# Patient Record
Sex: Female | Born: 1980 | Race: Black or African American | Hispanic: No | Marital: Married | Smoking: Never smoker
Health system: Southern US, Community
[De-identification: ages and names within clinical notes are randomized; demographics above are authoritative.]

---

## 2020-03-13 ENCOUNTER — Ambulatory Visit (HOSPITAL_COMMUNITY)
Admission: EM | Admit: 2020-03-13 | Discharge: 2020-03-13 | Disposition: A | Discharge status: Home / Self Care | Payer: Medicaid Other | Attending: Family Medicine | Admitting: Family Medicine

## 2020-03-13 ENCOUNTER — Other Ambulatory Visit: Payer: Self-pay

## 2020-03-13 ENCOUNTER — Ambulatory Visit (HOSPITAL_COMMUNITY): Payer: Medicaid Other

## 2020-03-13 ENCOUNTER — Encounter (HOSPITAL_COMMUNITY): Payer: Self-pay

## 2020-03-13 ENCOUNTER — Ambulatory Visit (INDEPENDENT_AMBULATORY_CARE_PROVIDER_SITE_OTHER): Payer: Medicaid Other

## 2020-03-13 DIAGNOSIS — R112 Nausea with vomiting, unspecified: Secondary | ICD-10-CM | POA: Insufficient documentation

## 2020-03-13 DIAGNOSIS — R1110 Vomiting, unspecified: Secondary | ICD-10-CM | POA: Diagnosis not present

## 2020-03-13 LAB — CBC WITH DIFFERENTIAL/PLATELET
Abs Immature Granulocytes: 0.03 10*3/uL (ref 0.00–0.07)
Basophils Absolute: 0 10*3/uL (ref 0.0–0.1)
Basophils Relative: 1 %
Eosinophils Absolute: 0.1 10*3/uL (ref 0.0–0.5)
Eosinophils Relative: 2 %
HCT: 41.4 % (ref 36.0–46.0)
Hemoglobin: 13.8 g/dL (ref 12.0–15.0)
Immature Granulocytes: 1 %
Lymphocytes Relative: 26 %
Lymphs Abs: 1.7 10*3/uL (ref 0.7–4.0)
MCH: 29.7 pg (ref 26.0–34.0)
MCHC: 33.3 g/dL (ref 30.0–36.0)
MCV: 89.2 fL (ref 80.0–100.0)
Monocytes Absolute: 0.5 10*3/uL (ref 0.1–1.0)
Monocytes Relative: 7 %
Neutro Abs: 4.2 10*3/uL (ref 1.7–7.7)
Neutrophils Relative %: 63 %
Platelets: 224 10*3/uL (ref 150–400)
RBC: 4.64 MIL/uL (ref 3.87–5.11)
RDW: 13.4 % (ref 11.5–15.5)
WBC: 6.5 10*3/uL (ref 4.0–10.5)
nRBC: 0 % (ref 0.0–0.2)

## 2020-03-13 LAB — POCT URINALYSIS DIPSTICK, ED / UC
Glucose, UA: NEGATIVE mg/dL
Ketones, ur: 80 mg/dL — AB
Leukocytes,Ua: NEGATIVE
Nitrite: NEGATIVE
Protein, ur: 30 mg/dL — AB
Specific Gravity, Urine: >=1.03 (ref 1.005–1.030)
Urobilinogen, UA: 1 mg/dL (ref 0.0–1.0)
pH: 5 (ref 5.0–8.0)

## 2020-03-13 LAB — COMPREHENSIVE METABOLIC PANEL
ALT: 16 U/L (ref 0–44)
AST: 17 U/L (ref 15–41)
Albumin: 3.8 g/dL (ref 3.5–5.0)
Alkaline Phosphatase: 55 U/L (ref 38–126)
Anion gap: 12 (ref 5–15)
BUN: 10 mg/dL (ref 6–20)
CO2: 22 mmol/L (ref 22–32)
Calcium: 9.2 mg/dL (ref 8.9–10.3)
Chloride: 106 mmol/L (ref 98–111)
Creatinine, Ser: 0.94 mg/dL (ref 0.44–1.00)
GFR, Estimated: >60 mL/min (ref >60)
Glucose, Bld: 87 mg/dL (ref 70–99)
Potassium: 4.4 mmol/L (ref 3.5–5.1)
Sodium: 140 mmol/L (ref 135–145)
Total Bilirubin: 0.8 mg/dL (ref 0.3–1.2)
Total Protein: 7.1 g/dL (ref 6.5–8.1)

## 2020-03-13 LAB — POC URINE PREG, ED: Preg Test, Ur: NEGATIVE

## 2020-03-13 LAB — LIPASE, BLOOD: Lipase: 28 U/L (ref 11–51)

## 2020-03-13 MED ORDER — ONDANSETRON HCL 4 MG/2ML IJ SOLN
4.0000 mg | Freq: Once | INTRAMUSCULAR | Status: AC
  Administered 2020-03-13: 4 mg via INTRAVENOUS

## 2020-03-13 MED ORDER — OMEPRAZOLE 20 MG PO CPDR: 20.0000 mg | DELAYED_RELEASE_CAPSULE | Freq: Two times a day (BID) | ORAL | 0 refills | Status: AC

## 2020-03-13 MED ORDER — ONDANSETRON HCL 4 MG PO TABS
4.0000 mg | ORAL_TABLET | Freq: Three times a day (TID) | ORAL | 0 refills | Status: AC | PRN
Start: 2020-03-13 — End: ?

## 2020-03-13 MED ORDER — SODIUM CHLORIDE 0.9 % IV BOLUS
1000.0000 mL | Freq: Once | INTRAVENOUS | Status: AC
  Administered 2020-03-13: 1000 mL via INTRAVENOUS

## 2020-03-13 MED ORDER — ONDANSETRON HCL 4 MG/2ML IJ SOLN
INTRAMUSCULAR | Status: AC
  Filled 2020-03-13: qty 2

## 2020-03-13 NOTE — ED Notes (Signed)
IV fluids infusing.  Pt assisted to restroom to provide urine sample.

## 2020-03-13 NOTE — Discharge Instructions (Addendum)
Take omeprazole 2 times a day This will reduce stomach acid Take Zofran as needed for nausea and vomiting Try to increase your fluids. Call or return if not improving in a couple days Go to ER immediately if you are worse instead of better

## 2020-03-13 NOTE — ED Provider Notes (Signed)
[facility]    CSN: [id number] Arrival date & time: 03/13/20  8295      History   Chief Complaint Chief Complaint  Patient presents with  . Emesis    x 1 week  . Weakness    x 1 week    HPI Miranda Henry is a 39 y.o. female.   HPI Patient states that she is generally in good health.  On no prescription medications.  She has had nausea and vomiting for a week.  Is unable to keep down solid food.  Has not had much in the way of liquids.  She states that she had bowel movements earlier in the week but has not had one since Wednesday.  No loose bowels.  No fever or chills.  She did not have any exposure to illness.  Her 4 children and husband are well.  No foods that do not agree with her food allergies.  She is certain that she is not pregnant.  She has very little abdominal pain, just some crampiness at times.  Anything she eats comes right back up.  She states that she started to feel weak.  When she sits up quickly she feels like she is going to pass out.  She feels like she is dehydrated. She does not take NSAID's on a regular basis She does not drink alcohol She has rare use of marijuana History reviewed. No pertinent past medical history.  There are no problems to display for this patient.   History reviewed. No pertinent surgical history.  OB History   No obstetric history on file.      Home Medications    Prior to Admission medications   Medication Sig Start Date End Date Taking? Authorizing Provider  omeprazole (PRILOSEC) 20 MG capsule Take 1 capsule (20 mg total) by mouth 2 (two) times daily before a meal. 03/13/20   Eustace Moore, MD  ondansetron (ZOFRAN) 4 MG tablet Take 1-2 tablets (4-8 mg total) by mouth every 8 (eight) hours as needed for nausea or vomiting. As needed nausea 03/13/20   Eustace Moore, MD    Family History History reviewed. No pertinent family history.  Social History Social History   Tobacco Use  . Smoking  status: Never Smoker  . Smokeless tobacco: Never Used  Vaping Use  . Vaping Use: Never used  Substance Use Topics  . Alcohol use: Yes    Comment: occasionally  . Drug use: Never     Allergies   Patient has no known allergies.   Review of Systems Review of Systems See HPI  Physical Exam Triage Vital Signs ED Triage Vitals  Enc Vitals Group     BP 03/13/20 1106 127/62     Pulse Rate 03/13/20 1106 100     Resp 03/13/20 1106 18     Temp 03/13/20 1106 98.3 F (36.8 C)     Temp Source 03/13/20 1106 Oral     SpO2 03/13/20 1106 100 %     Weight --      Height --      Head Circumference --      Peak Flow --      Pain Score 03/13/20 1108 7     Pain Loc --      Pain Edu? --      Excl. in GC? --    No data found.  Updated Vital Signs BP 127/62 (BP Location: Right Arm)   Pulse 100   Temp 98.3  F (36.8 C) (Oral)   Resp 18   LMP 03/07/2020 (Exact Date)   SpO2 100%     Physical Exam Constitutional:      General: She is not in acute distress.    Appearance: She is well-developed. She is obese.  HENT:     Head: Normocephalic and atraumatic.     Mouth/Throat:     Comments: Mouth is dry Eyes:     Conjunctiva/sclera: Conjunctivae normal.     Pupils: Pupils are equal, round, and reactive to light.  Cardiovascular:     Rate and Rhythm: Tachycardia present.     Heart sounds: Normal heart sounds.  Pulmonary:     Effort: Pulmonary effort is normal. No respiratory distress.     Breath sounds: Normal breath sounds.  Abdominal:     General: Bowel sounds are normal. There is no distension.     Palpations: Abdomen is soft.     Tenderness: There is no abdominal tenderness.     Comments: Bowel sounds are active.  Abdomen is benign  Musculoskeletal:        General: Normal range of motion.     Cervical back: Normal range of motion.  Skin:    General: Skin is warm and dry.  Neurological:     Mental Status: She is alert.  Psychiatric:        Behavior: Behavior normal.       UC Treatments / Results  Labs (all labs ordered are listed, but only abnormal results are displayed) Labs Reviewed  POCT URINALYSIS DIPSTICK, ED / UC - Abnormal; Notable for the following components:      Result Value   Bilirubin Urine MODERATE (*)    Ketones, ur 80 (*)    Hgb urine dipstick TRACE (*)    Protein, ur 30 (*)    All other components within normal limits  CBC WITH DIFFERENTIAL/PLATELET  COMPREHENSIVE METABOLIC PANEL  LIPASE, BLOOD  POC URINE PREG, ED   Blood work fails to reveal any source of medical illness or abnormality that would cause vomiting.  CBC normal.  CMP normal.  Lipase normal.  Urinalysis shows signs of dehydration, but not infection.  Patient states that she feels dramatically better after liter of IV fluids and a dose of Zofran.  Will send her home with omeprazole twice a day and Zofran as needed.  She is instructed to have frequent small amounts of fluids throughout the day to try to get further rehydrated.  Will need to go to ER if she continues with emesis    Radiology DG Abd 2 Views  Result Date: 03/13/2020 CLINICAL DATA:  39 year old female with vomiting. EXAM: ABDOMEN - 2 VIEW COMPARISON:  None. FINDINGS: Upright and supine views of the abdomen and pelvis, although not including the level of the diaphragm. Non obstructed bowel gas pattern. Abdominal and pelvic visceral contours are normal. Negative visible osseous structures. IMPRESSION: Nonobstructed bowel-gas pattern. Electronically Signed   By: Odessa Fleming M.D.   On: 03/13/2020 13:07    Procedures Procedures (including critical care time)  Medications Ordered in UC Medications  sodium chloride 0.9 % bolus 1,000 mL (0 mLs Intravenous Stopped 03/13/20 1312)  ondansetron (ZOFRAN) injection 4 mg (4 mg Intravenous Given 03/13/20 1205)    Initial Impression / Assessment and Plan / UC Course  I have reviewed the triage vital signs and the nursing notes.  Pertinent labs & imaging results that  were available during my care of the patient were reviewed by  me and considered in my medical decision making (see chart for details).     Final Clinical Impressions(s) / UC Diagnoses   Final diagnoses:  Intractable vomiting with nausea, unspecified vomiting type     Discharge Instructions     Take omeprazole 2 times a day This will reduce stomach acid Take Zofran as needed for nausea and vomiting Try to increase your fluids. Call or return if not improving in a couple days Go to ER immediately if you are worse instead of better    ED Prescriptions    Medication Sig Dispense Auth. Provider   omeprazole (PRILOSEC) 20 MG capsule Take 1 capsule (20 mg total) by mouth 2 (two) times daily before a meal. 30 capsule Eustace Moore, MD   ondansetron (ZOFRAN) 4 MG tablet Take 1-2 tablets (4-8 mg total) by mouth every 8 (eight) hours as needed for nausea or vomiting. As needed nausea 20 tablet Eustace Moore, MD     PDMP not reviewed this encounter.   Eustace Moore, MD 03/13/20 1359

## 2020-03-13 NOTE — ED Triage Notes (Signed)
Patient states she has been vomiting and nauseous x 1 week and is also complaining of generalized weakness. Pt is aox4 and ambulatory.

## 2020-03-14 ENCOUNTER — Ambulatory Visit (HOSPITAL_COMMUNITY): Payer: Self-pay

## 2022-05-07 IMAGING — DX DG ABDOMEN 2V
2 series · 2 of 2 positions shown · non-contrast
Comparison: None.

CLINICAL DATA: 39-year-old female with vomiting.

EXAM:
ABDOMEN - 2 VIEW

[abdomen erect]
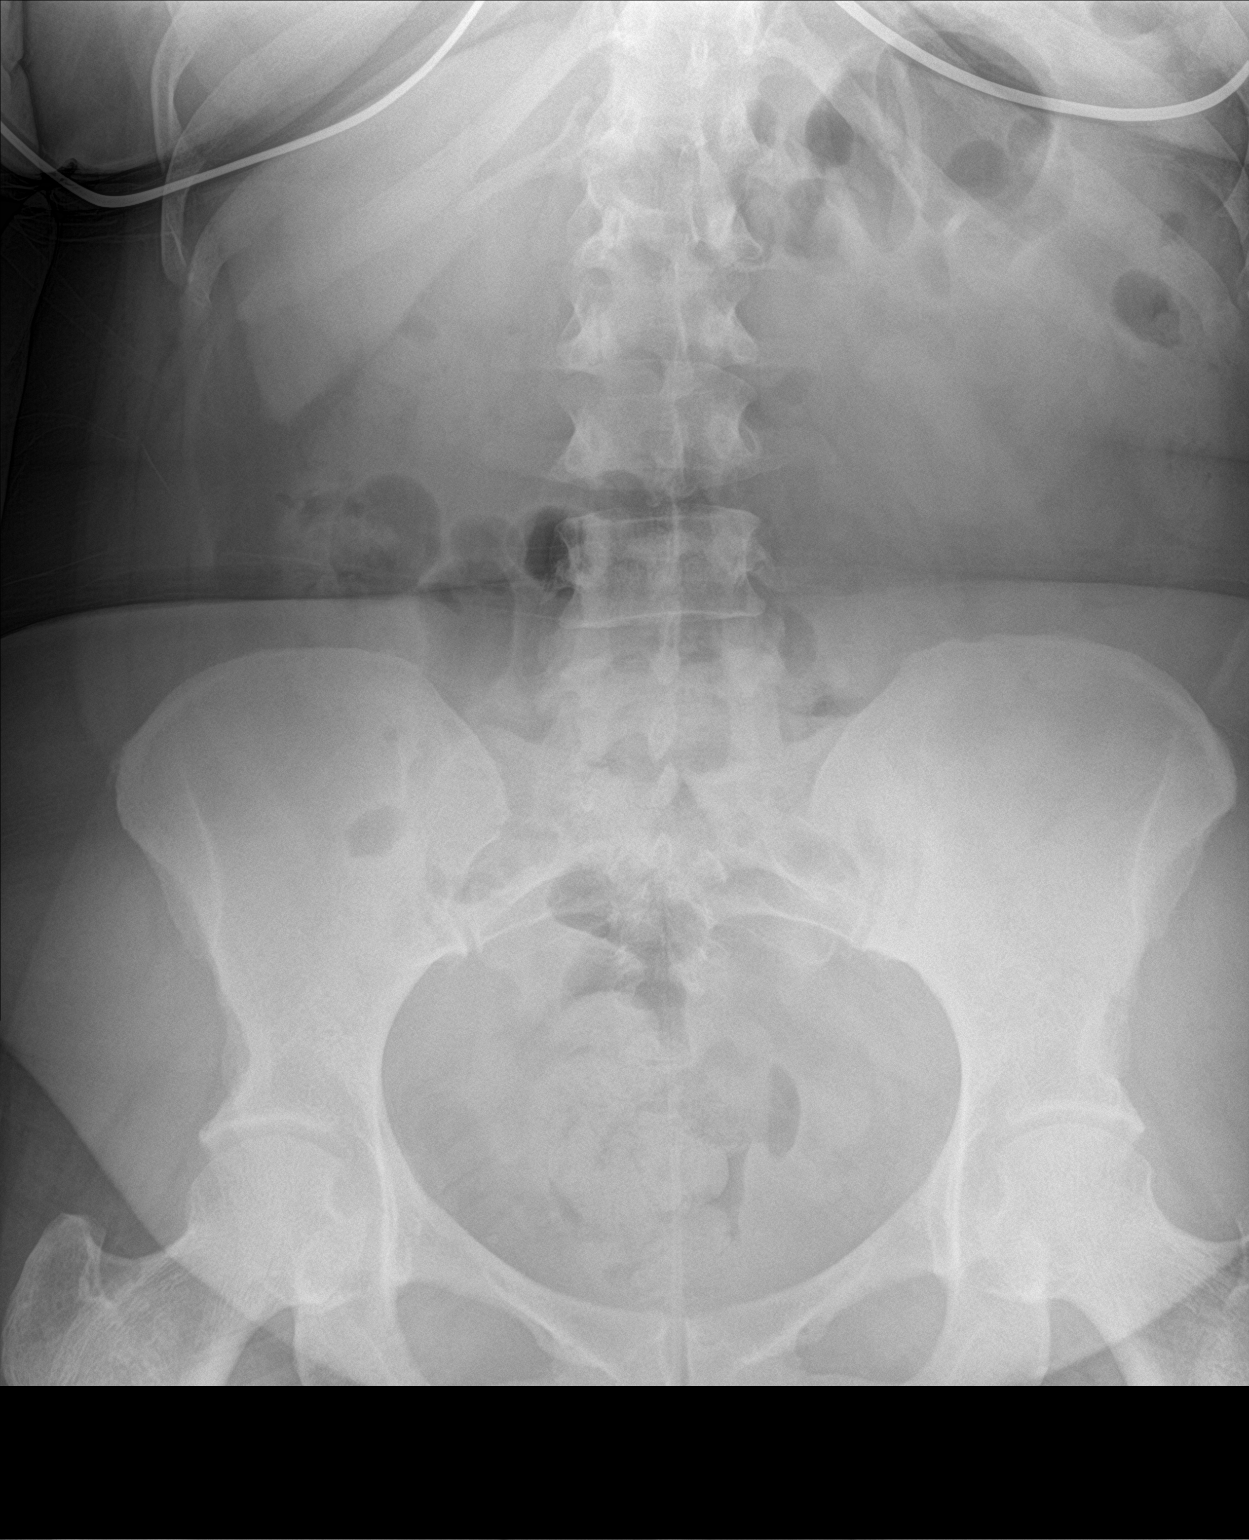

[abdomen supine]
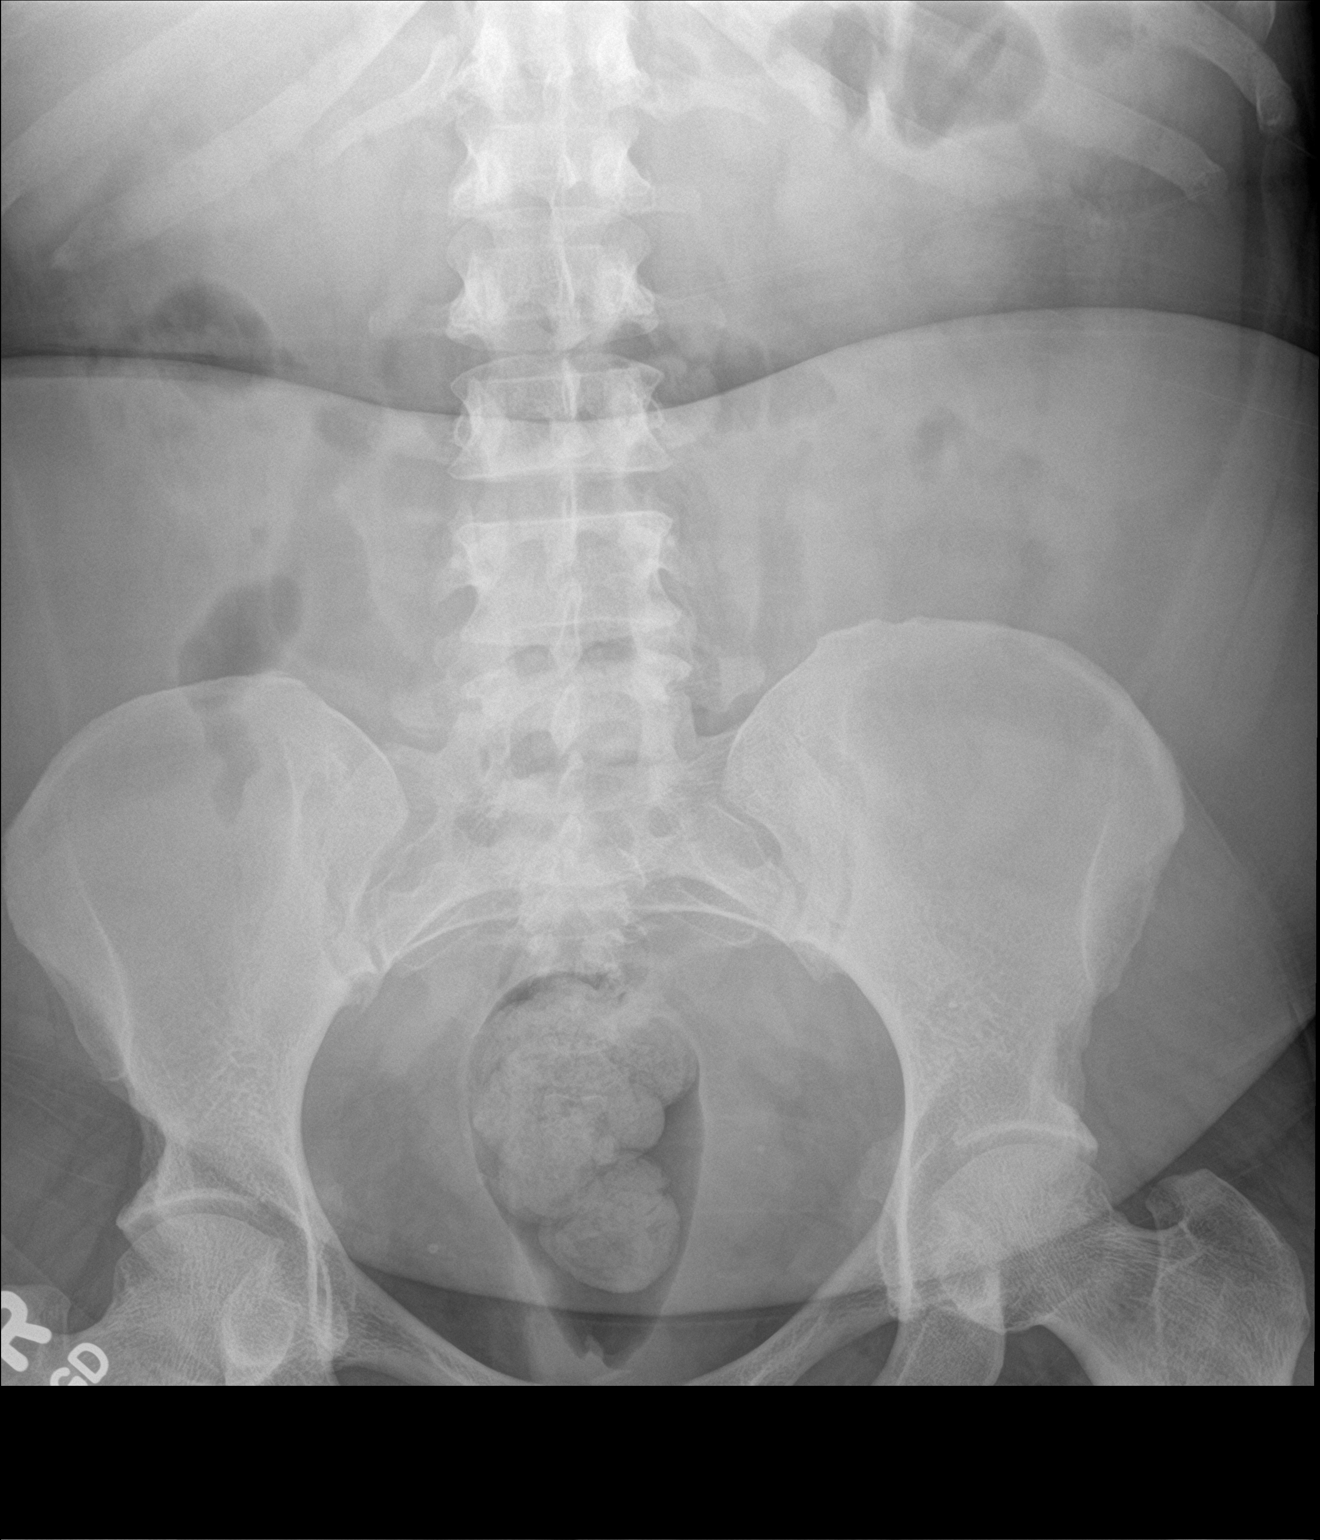

[2 of 2 positions shown; findings below may reference images not displayed]

FINDINGS: Upright and supine views of the abdomen and pelvis, although not
including the level of the diaphragm. Non obstructed bowel gas
pattern. Abdominal and pelvic visceral contours are normal. Negative
visible osseous structures.
IMPRESSION: Nonobstructed bowel-gas pattern.
